# Patient Record
Sex: Male | Born: 1935 | Race: White | Hispanic: No | Marital: Married | Smoking: Former smoker
Health system: Southern US, Community
[De-identification: ages and names within clinical notes are randomized; demographics above are authoritative.]

## PROBLEM LIST (undated history)

## (undated) HISTORY — PX: BACK SURGERY: SHX140

## (undated) HISTORY — PX: OTHER SURGICAL HISTORY: SHX169

## (undated) HISTORY — PX: THYROIDECTOMY: SHX17

---

## 2010-12-08 ENCOUNTER — Other Ambulatory Visit: Payer: Self-pay | Admitting: Orthopaedic Surgery

## 2010-12-08 DIAGNOSIS — M545 Low back pain: Secondary | ICD-10-CM

## 2010-12-14 ENCOUNTER — Ambulatory Visit
Admission: RE | Admit: 2010-12-14 | Discharge: 2010-12-14 | Disposition: A | Discharge status: Home / Self Care | Payer: Medicare PPO | Source: Ambulatory Visit | Attending: Orthopaedic Surgery | Admitting: Orthopaedic Surgery

## 2010-12-14 DIAGNOSIS — M545 Low back pain: Secondary | ICD-10-CM

## 2013-12-03 ENCOUNTER — Other Ambulatory Visit: Payer: Self-pay | Admitting: Rehabilitation

## 2013-12-03 DIAGNOSIS — M549 Dorsalgia, unspecified: Secondary | ICD-10-CM

## 2013-12-04 ENCOUNTER — Ambulatory Visit
Admission: RE | Admit: 2013-12-04 | Discharge: 2013-12-04 | Disposition: A | Discharge status: Home / Self Care | Payer: Medicare PPO | Source: Ambulatory Visit | Attending: Rehabilitation | Admitting: Rehabilitation

## 2013-12-04 DIAGNOSIS — M549 Dorsalgia, unspecified: Secondary | ICD-10-CM

## 2014-07-07 ENCOUNTER — Other Ambulatory Visit: Payer: Self-pay | Admitting: Orthopedic Surgery

## 2014-07-07 DIAGNOSIS — M545 Low back pain: Secondary | ICD-10-CM

## 2014-07-12 ENCOUNTER — Ambulatory Visit
Admission: RE | Admit: 2014-07-12 | Discharge: 2014-07-12 | Disposition: A | Discharge status: Home / Self Care | Payer: Medicare PPO | Source: Ambulatory Visit | Attending: Orthopedic Surgery | Admitting: Orthopedic Surgery

## 2014-07-12 DIAGNOSIS — M545 Low back pain: Secondary | ICD-10-CM

## 2014-09-09 ENCOUNTER — Other Ambulatory Visit: Payer: Self-pay | Admitting: Orthopedic Surgery

## 2014-09-09 DIAGNOSIS — M961 Postlaminectomy syndrome, not elsewhere classified: Secondary | ICD-10-CM

## 2014-09-18 ENCOUNTER — Ambulatory Visit
Admission: RE | Admit: 2014-09-18 | Discharge: 2014-09-18 | Disposition: A | Discharge status: Home / Self Care | Payer: Medicare PPO | Source: Ambulatory Visit | Attending: Orthopedic Surgery | Admitting: Orthopedic Surgery

## 2014-09-18 DIAGNOSIS — M961 Postlaminectomy syndrome, not elsewhere classified: Secondary | ICD-10-CM

## 2015-06-30 DIAGNOSIS — G4733 Obstructive sleep apnea (adult) (pediatric): Secondary | ICD-10-CM

## 2015-06-30 DIAGNOSIS — G3184 Mild cognitive impairment, so stated: Secondary | ICD-10-CM | POA: Insufficient documentation

## 2015-06-30 HISTORY — DX: Mild cognitive impairment of uncertain or unknown etiology: G31.84

## 2015-06-30 HISTORY — DX: Obstructive sleep apnea (adult) (pediatric): G47.33

## 2015-09-28 DIAGNOSIS — G4761 Periodic limb movement disorder: Secondary | ICD-10-CM | POA: Insufficient documentation

## 2015-09-28 HISTORY — DX: Periodic limb movement disorder: G47.61

## 2016-01-13 DIAGNOSIS — I259 Chronic ischemic heart disease, unspecified: Secondary | ICD-10-CM | POA: Insufficient documentation

## 2016-01-13 DIAGNOSIS — I119 Hypertensive heart disease without heart failure: Secondary | ICD-10-CM | POA: Insufficient documentation

## 2016-01-13 DIAGNOSIS — Z79899 Other long term (current) drug therapy: Secondary | ICD-10-CM | POA: Insufficient documentation

## 2016-01-13 DIAGNOSIS — K219 Gastro-esophageal reflux disease without esophagitis: Secondary | ICD-10-CM

## 2016-01-13 DIAGNOSIS — M51379 Other intervertebral disc degeneration, lumbosacral region without mention of lumbar back pain or lower extremity pain: Secondary | ICD-10-CM | POA: Insufficient documentation

## 2016-01-13 DIAGNOSIS — M159 Polyosteoarthritis, unspecified: Secondary | ICD-10-CM

## 2016-01-13 DIAGNOSIS — M2550 Pain in unspecified joint: Secondary | ICD-10-CM | POA: Insufficient documentation

## 2016-01-13 DIAGNOSIS — I6529 Occlusion and stenosis of unspecified carotid artery: Secondary | ICD-10-CM | POA: Insufficient documentation

## 2016-01-13 DIAGNOSIS — K5730 Diverticulosis of large intestine without perforation or abscess without bleeding: Secondary | ICD-10-CM | POA: Insufficient documentation

## 2016-01-13 DIAGNOSIS — R7309 Other abnormal glucose: Secondary | ICD-10-CM

## 2016-01-13 HISTORY — DX: Chronic ischemic heart disease, unspecified: I25.9

## 2016-01-13 HISTORY — DX: Other abnormal glucose: R73.09

## 2016-01-13 HISTORY — DX: Occlusion and stenosis of unspecified carotid artery: I65.29

## 2016-01-13 HISTORY — DX: Other intervertebral disc degeneration, lumbosacral region without mention of lumbar back pain or lower extremity pain: M51.379

## 2016-01-13 HISTORY — DX: Gastro-esophageal reflux disease without esophagitis: K21.9

## 2016-01-13 HISTORY — DX: Diverticulosis of large intestine without perforation or abscess without bleeding: K57.30

## 2016-01-13 HISTORY — DX: Polyosteoarthritis, unspecified: M15.9

## 2016-01-13 HISTORY — DX: Other long term (current) drug therapy: Z79.899

## 2016-01-13 HISTORY — DX: Hypertensive heart disease without heart failure: I11.9

## 2016-01-13 HISTORY — DX: Pain in unspecified joint: M25.50

## 2016-01-14 DIAGNOSIS — G3184 Mild cognitive impairment, so stated: Secondary | ICD-10-CM

## 2016-01-14 HISTORY — DX: Mild cognitive impairment of uncertain or unknown etiology: G31.84

## 2016-07-20 DIAGNOSIS — J301 Allergic rhinitis due to pollen: Secondary | ICD-10-CM | POA: Insufficient documentation

## 2016-07-20 DIAGNOSIS — N400 Enlarged prostate without lower urinary tract symptoms: Secondary | ICD-10-CM | POA: Insufficient documentation

## 2016-07-20 HISTORY — DX: Benign prostatic hyperplasia without lower urinary tract symptoms: N40.0

## 2016-07-20 HISTORY — DX: Allergic rhinitis due to pollen: J30.1

## 2017-03-15 IMAGING — MR MR LUMBAR SPINE W/O CM
5 series · 32 of 48 positions shown · non-contrast
Comparison: MRI lumbar spine 07/12/2014 and 12/04/2013.

CLINICAL DATA: Progressive low back pain extending into the right
hip and lower extremity. Lumbar spine surgery 8 months ago.
Post-laminectomy syndrome.

EXAM:
MRI LUMBAR SPINE WITHOUT CONTRAST
TECHNIQUE: Multiplanar, multisequence MR imaging of the lumbar spine was
performed. No intravenous contrast was administered.

[Series 2: T2 post-contrast · sagittal · 4.0mm · 0.88mm/px · 7 of 14 slices shown]
[im 1/14]
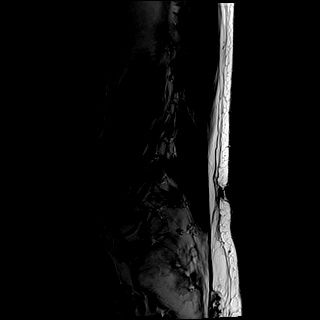
[im 3/14]
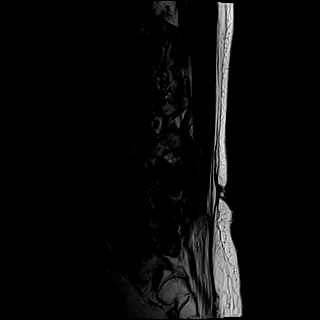
[im 5/14]
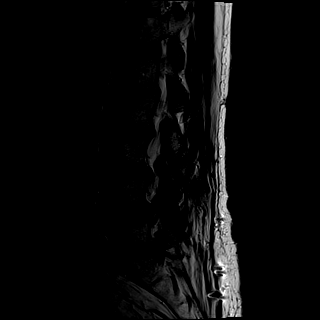
[im 7/14]
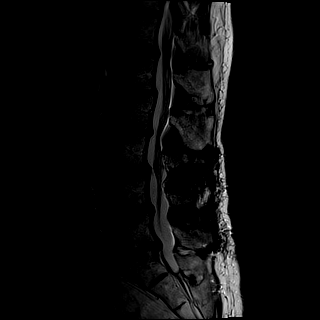
[im 9/14]
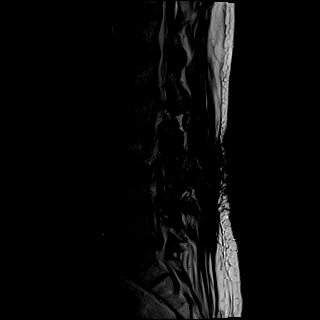
[im 11/14]
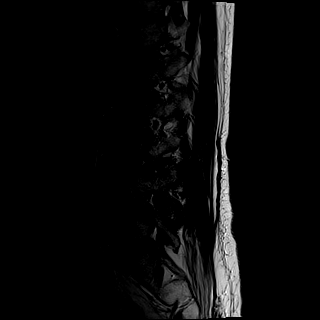
[im 14/14]
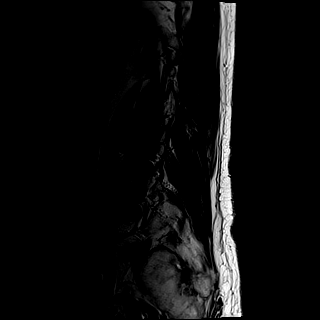

[Series 3: T1 · sagittal · 4.0mm · 0.88mm/px · 7 of 14 slices shown (1 of 2)]
[im 1/14]
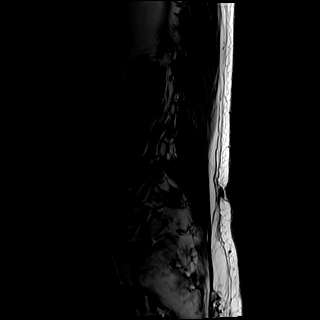
[im 3/14]
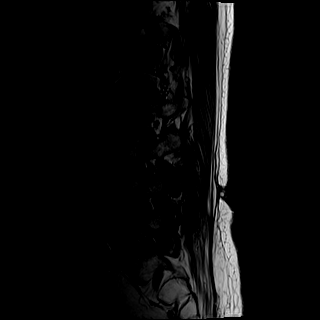
[im 5/14]
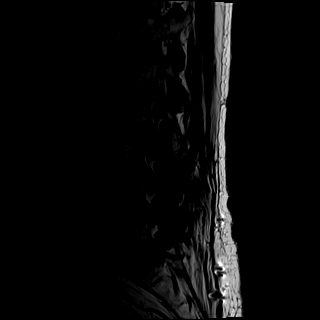
[im 7/14]
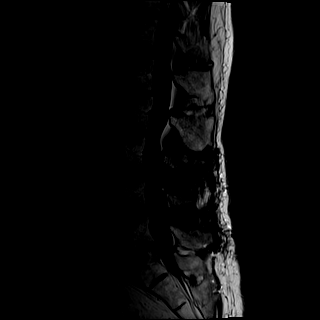
[im 9/14]
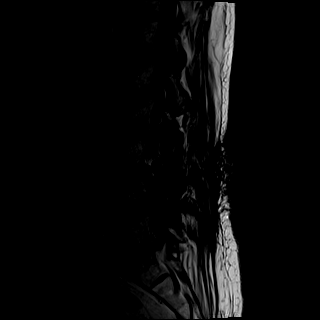
[im 11/14]
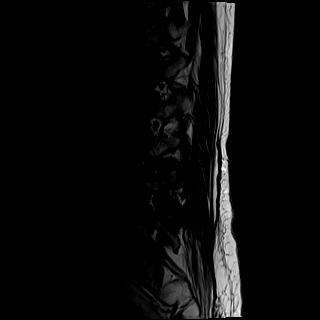
[im 14/14]
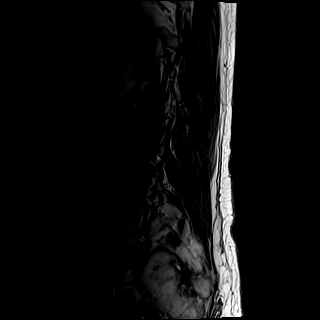

[Series 4: STIR · sagittal · 4.0mm · 0.55mm/px · 2 of 14 slices shown]
[im 1/14]
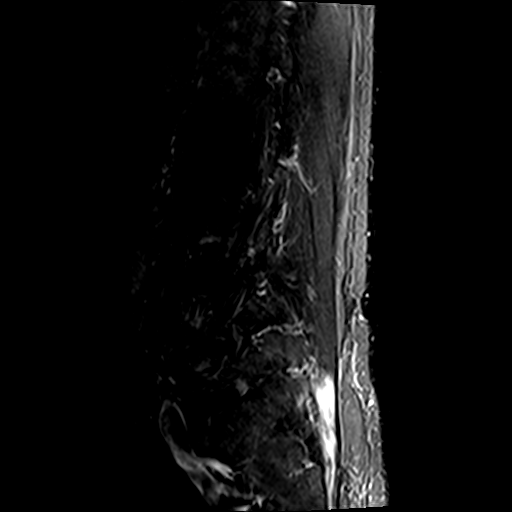
[im 3/14]
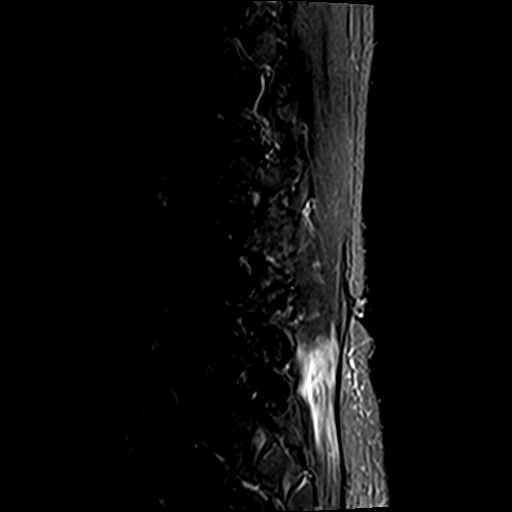

[Series 5: T1 · axial · 4.0mm · 0.37mm/px · z∈[-106,+119]mm · 8 of 32 slices shown (2 of 2)]
[im 1/32]
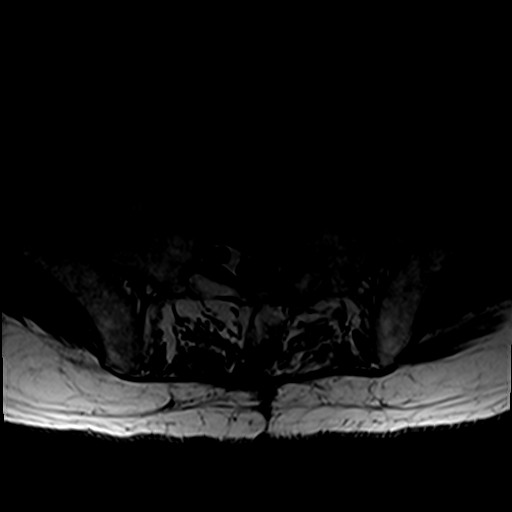
[im 5/32]
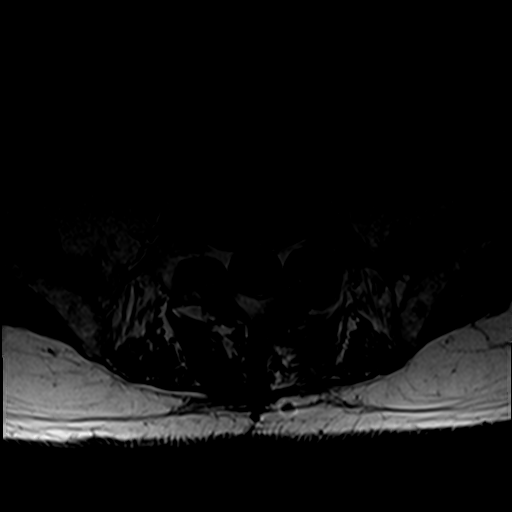
[im 10/32]
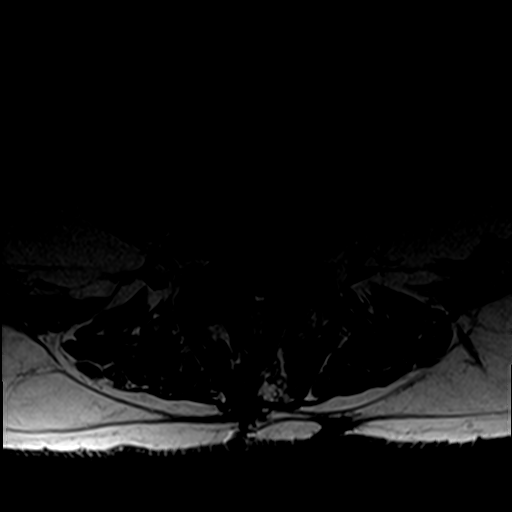
[im 15/32]
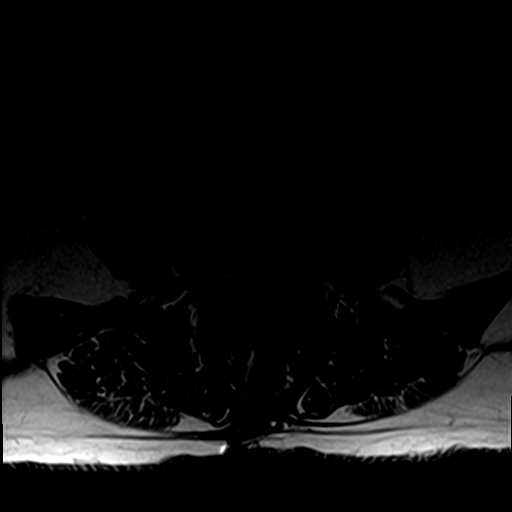
[im 17/32]
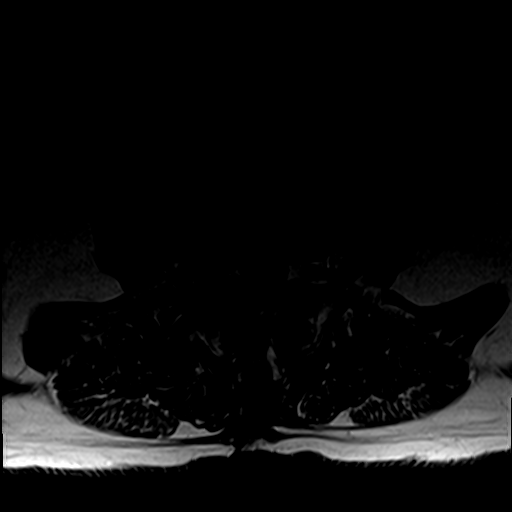
[im 22/32]
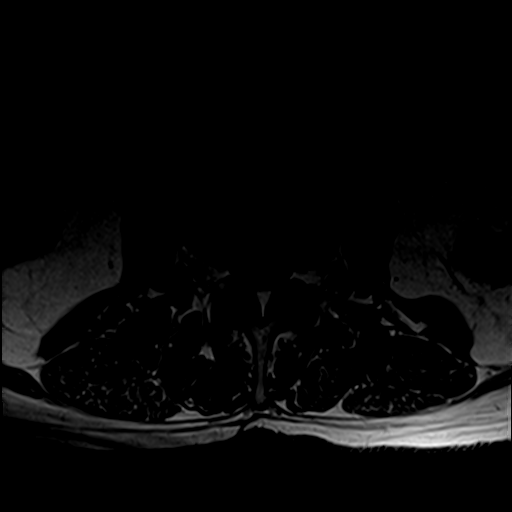
[im 27/32]
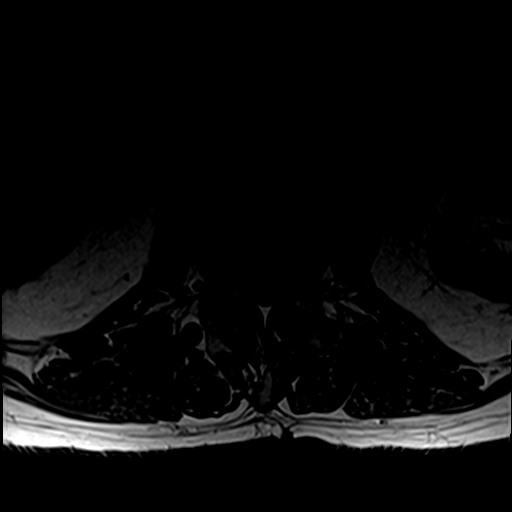
[im 32/32]
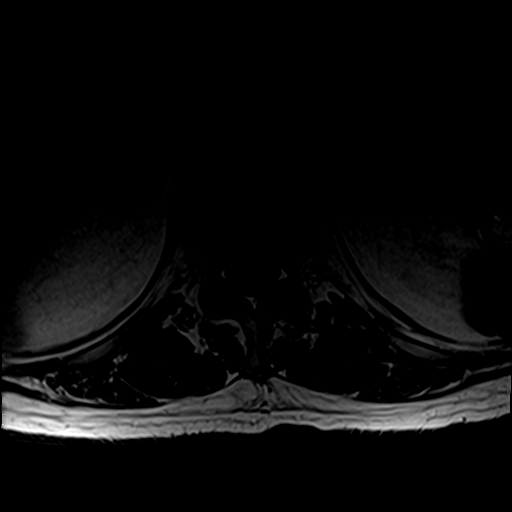

[Series 6: T2 · axial · 4.0mm · 0.74mm/px · z∈[-106,+119]mm · 8 of 32 slices shown]
[im 1/32]
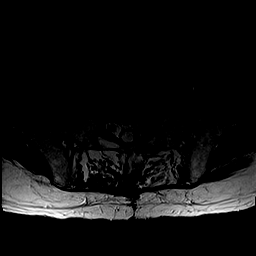
[im 5/32]
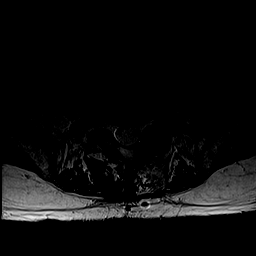
[im 10/32]
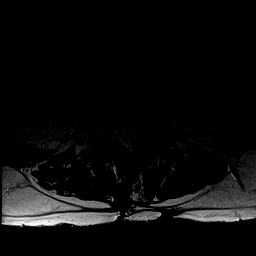
[im 15/32]
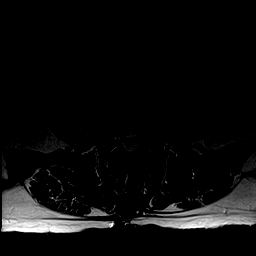
[im 17/32]
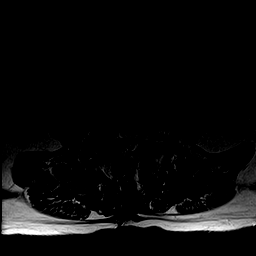
[im 22/32]
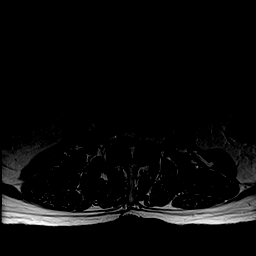
[im 27/32]
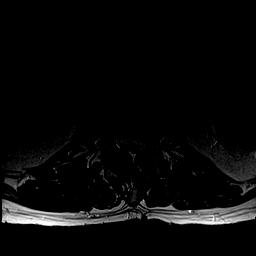
[im 32/32]
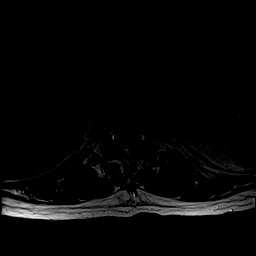

[32 of 48 positions shown; findings below may reference images not displayed]

FINDINGS: Normal signal is present in the conus medullaris which terminates at
T12-L1. A remote superior endplate fracture of L2 is stable. Marrow
signal and vertebral body heights are otherwise normal. Grade 1
anterolisthesis at L3-4 is similar to the prior study. Alignment is
otherwise anatomic. Loss of disc height and endplate changes at
L5-S1 is similar to the prior exam. Mild leftward curvature of the
lumbar spine is centered at L3.

Limited imaging of the abdomen is unremarkable. There is no
significant adenopathy.

T12-L1: Mild facet hypertrophy and disc bulging is similar to the
prior study. There is no significant focal stenosis.

L1-2: Mild facet hypertrophy is stable. There is no significant
stenosis.

L2-3: A broad-based disc protrusion is present. Moderate facet
hypertrophy is noted. Mild subarticular narrowing is stable, worse
on the left.

L3-4: A right laminotomy is noted. A residual rightward disc
protrusion is present. Moderate right subarticular and foraminal
stenosis persists. Mild left foraminal narrowing is noted.

L4-5: A broad-based disc protrusion is present. There is clumping of
the nerve roots as previously seen. Laminectomy is noted. Mild to
moderate foraminal narrowing is stable, worse on the left.

L5-S1: There is chronic loss of disc height. A left laminotomy is
noted. A broad-based disc protrusion is present. Asymmetric
left-sided facet hypertrophy is present. This results in moderate
left foraminal stenosis, unchanged.
IMPRESSION: 1. Stable postoperative changes at L3-4, L4-5, and L5-S1.
2. Moderate right subarticular and foraminal stenosis at L3-4 is the
most significant right-sided disease.
3. Mild to moderate foraminal stenosis bilaterally at L4-5 is worse
on the left, stable.
4. Moderate left foraminal stenosis at L5-S1 is stable.
5. Multilevel spondylosis is otherwise stable.
6. Remote superior endplate fracture at L2.

## 2017-04-18 DIAGNOSIS — D509 Iron deficiency anemia, unspecified: Secondary | ICD-10-CM

## 2017-04-18 HISTORY — DX: Iron deficiency anemia, unspecified: D50.9

## 2017-05-15 ENCOUNTER — Other Ambulatory Visit: Payer: Self-pay | Admitting: Orthopedic Surgery

## 2017-05-15 DIAGNOSIS — M899 Disorder of bone, unspecified: Secondary | ICD-10-CM

## 2017-06-08 ENCOUNTER — Ambulatory Visit
Admission: RE | Admit: 2017-06-08 | Discharge: 2017-06-08 | Disposition: A | Discharge status: Home / Self Care | Payer: Medicare PPO | Source: Ambulatory Visit | Attending: Orthopedic Surgery | Admitting: Orthopedic Surgery

## 2017-06-08 DIAGNOSIS — M899 Disorder of bone, unspecified: Secondary | ICD-10-CM

## 2017-06-30 ENCOUNTER — Other Ambulatory Visit: Payer: Self-pay | Admitting: Orthopedic Surgery

## 2017-07-20 DIAGNOSIS — M810 Age-related osteoporosis without current pathological fracture: Secondary | ICD-10-CM | POA: Insufficient documentation

## 2017-07-20 HISTORY — DX: Age-related osteoporosis without current pathological fracture: M81.0

## 2017-10-12 DIAGNOSIS — D513 Other dietary vitamin B12 deficiency anemia: Secondary | ICD-10-CM

## 2017-10-12 HISTORY — DX: Other dietary vitamin B12 deficiency anemia: D51.3

## 2017-12-15 DIAGNOSIS — E039 Hypothyroidism, unspecified: Secondary | ICD-10-CM | POA: Insufficient documentation

## 2017-12-15 HISTORY — DX: Hypothyroidism, unspecified: E03.9

## 2018-05-22 ENCOUNTER — Telehealth: Payer: Self-pay | Admitting: Gastroenterology

## 2018-05-22 NOTE — Telephone Encounter (Signed)
Dr.Thomas Martha ClanWhyte patient's PCP sent a referral for Dr.Gupta to see patient for follow up colonoscopy and to discuss and EGD. Spoke with patient states he will have his wife call back and schedule an appointment. States he needs an afternoon appointment.

## 2018-06-14 DIAGNOSIS — H6991 Unspecified Eustachian tube disorder, right ear: Secondary | ICD-10-CM

## 2018-06-14 DIAGNOSIS — H7403 Tympanosclerosis, bilateral: Secondary | ICD-10-CM | POA: Insufficient documentation

## 2018-06-14 HISTORY — DX: Unspecified eustachian tube disorder, right ear: H69.91

## 2018-06-14 HISTORY — DX: Tympanosclerosis, bilateral: H74.03

## 2018-07-09 DIAGNOSIS — Z8781 Personal history of (healed) traumatic fracture: Secondary | ICD-10-CM | POA: Insufficient documentation

## 2018-07-09 HISTORY — DX: Personal history of (healed) traumatic fracture: Z87.81

## 2018-07-12 ENCOUNTER — Other Ambulatory Visit: Payer: Self-pay | Admitting: Physician Assistant

## 2018-07-12 DIAGNOSIS — M810 Age-related osteoporosis without current pathological fracture: Secondary | ICD-10-CM

## 2018-07-12 DIAGNOSIS — Z8781 Personal history of (healed) traumatic fracture: Secondary | ICD-10-CM

## 2018-07-24 DIAGNOSIS — R269 Unspecified abnormalities of gait and mobility: Secondary | ICD-10-CM

## 2018-07-24 HISTORY — DX: Unspecified abnormalities of gait and mobility: R26.9

## 2018-08-14 DIAGNOSIS — R195 Other fecal abnormalities: Secondary | ICD-10-CM | POA: Insufficient documentation

## 2018-08-14 HISTORY — DX: Other fecal abnormalities: R19.5

## 2018-09-07 ENCOUNTER — Encounter: Payer: Self-pay | Admitting: Family Medicine

## 2018-10-10 ENCOUNTER — Other Ambulatory Visit: Payer: Medicare PPO

## 2018-12-12 ENCOUNTER — Other Ambulatory Visit: Payer: Self-pay

## 2018-12-12 ENCOUNTER — Ambulatory Visit
Admission: RE | Admit: 2018-12-12 | Discharge: 2018-12-12 | Disposition: A | Discharge status: Home / Self Care | Payer: Medicare PPO | Source: Ambulatory Visit | Attending: Physician Assistant | Admitting: Physician Assistant

## 2018-12-12 DIAGNOSIS — Z8781 Personal history of (healed) traumatic fracture: Secondary | ICD-10-CM

## 2018-12-12 DIAGNOSIS — M810 Age-related osteoporosis without current pathological fracture: Secondary | ICD-10-CM

## 2018-12-14 ENCOUNTER — Other Ambulatory Visit: Payer: Medicare PPO

## 2019-07-30 ENCOUNTER — Other Ambulatory Visit: Payer: Self-pay | Admitting: Rehabilitation

## 2019-08-05 DIAGNOSIS — Z789 Other specified health status: Secondary | ICD-10-CM

## 2019-08-05 HISTORY — DX: Other specified health status: Z78.9

## 2019-08-13 DIAGNOSIS — M12812 Other specific arthropathies, not elsewhere classified, left shoulder: Secondary | ICD-10-CM | POA: Insufficient documentation

## 2019-08-13 DIAGNOSIS — G8929 Other chronic pain: Secondary | ICD-10-CM | POA: Insufficient documentation

## 2019-08-13 HISTORY — DX: Other specific arthropathies, not elsewhere classified, left shoulder: M12.812

## 2019-08-13 HISTORY — DX: Other chronic pain: G89.29

## 2019-08-15 ENCOUNTER — Telehealth: Payer: Self-pay

## 2019-08-15 NOTE — Telephone Encounter (Signed)
Made in error

## 2019-08-16 DIAGNOSIS — R001 Bradycardia, unspecified: Secondary | ICD-10-CM | POA: Insufficient documentation

## 2019-08-16 DIAGNOSIS — I480 Paroxysmal atrial fibrillation: Secondary | ICD-10-CM

## 2019-08-16 HISTORY — DX: Paroxysmal atrial fibrillation: I48.0

## 2019-08-16 HISTORY — DX: Bradycardia, unspecified: R00.1

## 2019-08-17 ENCOUNTER — Other Ambulatory Visit: Payer: Self-pay | Admitting: Rehabilitation

## 2019-08-22 DIAGNOSIS — R7303 Prediabetes: Secondary | ICD-10-CM

## 2019-08-22 DIAGNOSIS — Z87898 Personal history of other specified conditions: Secondary | ICD-10-CM | POA: Insufficient documentation

## 2019-08-22 DIAGNOSIS — Z9189 Other specified personal risk factors, not elsewhere classified: Secondary | ICD-10-CM | POA: Insufficient documentation

## 2019-08-22 DIAGNOSIS — I10 Essential (primary) hypertension: Secondary | ICD-10-CM

## 2019-08-22 HISTORY — DX: Essential (primary) hypertension: I10

## 2019-08-22 HISTORY — DX: Other specified personal risk factors, not elsewhere classified: Z91.89

## 2019-08-22 HISTORY — DX: Personal history of other specified conditions: Z87.898

## 2019-08-22 HISTORY — DX: Prediabetes: R73.03

## 2019-08-28 DIAGNOSIS — Z7901 Long term (current) use of anticoagulants: Secondary | ICD-10-CM

## 2019-08-28 DIAGNOSIS — E782 Mixed hyperlipidemia: Secondary | ICD-10-CM | POA: Insufficient documentation

## 2019-08-28 HISTORY — DX: Long term (current) use of anticoagulants: Z79.01

## 2019-08-28 HISTORY — DX: Mixed hyperlipidemia: E78.2

## 2019-09-03 DIAGNOSIS — Z96612 Presence of left artificial shoulder joint: Secondary | ICD-10-CM | POA: Insufficient documentation

## 2019-09-03 HISTORY — DX: Presence of left artificial shoulder joint: Z96.612

## 2020-10-02 DIAGNOSIS — F0390 Unspecified dementia without behavioral disturbance: Secondary | ICD-10-CM | POA: Insufficient documentation

## 2020-10-02 HISTORY — DX: Unspecified dementia, unspecified severity, without behavioral disturbance, psychotic disturbance, mood disturbance, and anxiety: F03.90

## 2021-10-14 DIAGNOSIS — I2510 Atherosclerotic heart disease of native coronary artery without angina pectoris: Secondary | ICD-10-CM

## 2021-10-14 HISTORY — DX: Atherosclerotic heart disease of native coronary artery without angina pectoris: I25.10

## 2022-04-21 DIAGNOSIS — I480 Paroxysmal atrial fibrillation: Secondary | ICD-10-CM

## 2022-04-21 DIAGNOSIS — D6869 Other thrombophilia: Secondary | ICD-10-CM

## 2022-04-21 DIAGNOSIS — N3946 Mixed incontinence: Secondary | ICD-10-CM

## 2022-04-21 HISTORY — DX: Mixed incontinence: N39.46

## 2022-04-21 HISTORY — DX: Paroxysmal atrial fibrillation: I48.0

## 2022-04-21 HISTORY — DX: Other thrombophilia: D68.69

## 2022-08-30 DIAGNOSIS — U071 COVID-19: Secondary | ICD-10-CM | POA: Insufficient documentation

## 2022-08-30 HISTORY — DX: COVID-19: U07.1

## 2022-09-13 DIAGNOSIS — F05 Delirium due to known physiological condition: Secondary | ICD-10-CM | POA: Insufficient documentation

## 2022-09-13 DIAGNOSIS — E871 Hypo-osmolality and hyponatremia: Secondary | ICD-10-CM

## 2022-09-13 HISTORY — DX: Delirium due to known physiological condition: F05

## 2022-09-13 HISTORY — DX: Hypo-osmolality and hyponatremia: E87.1

## 2022-09-13 HISTORY — DX: Hypocalcemia: E83.51

## 2022-10-17 DIAGNOSIS — G20C Parkinsonism, unspecified: Secondary | ICD-10-CM | POA: Insufficient documentation

## 2022-10-17 HISTORY — DX: Parkinsonism, unspecified: G20.C

## 2023-04-18 ENCOUNTER — Ambulatory Visit: Payer: Medicare PPO | Attending: Cardiology

## 2023-04-18 ENCOUNTER — Ambulatory Visit: Payer: Medicare PPO | Attending: Cardiology | Admitting: Cardiology

## 2023-04-18 ENCOUNTER — Encounter: Payer: Self-pay | Admitting: Cardiology

## 2023-04-18 VITALS — BP 110/42 | HR 72 | Ht 72.0 in | Wt 184.0 lb

## 2023-04-18 DIAGNOSIS — R0609 Other forms of dyspnea: Secondary | ICD-10-CM

## 2023-04-18 DIAGNOSIS — I2510 Atherosclerotic heart disease of native coronary artery without angina pectoris: Secondary | ICD-10-CM

## 2023-04-18 DIAGNOSIS — G20A1 Parkinson's disease without dyskinesia, without mention of fluctuations: Secondary | ICD-10-CM

## 2023-04-18 DIAGNOSIS — I10 Essential (primary) hypertension: Secondary | ICD-10-CM | POA: Diagnosis not present

## 2023-04-18 DIAGNOSIS — I4821 Permanent atrial fibrillation: Secondary | ICD-10-CM | POA: Diagnosis not present

## 2023-04-18 DIAGNOSIS — G4733 Obstructive sleep apnea (adult) (pediatric): Secondary | ICD-10-CM

## 2023-04-18 HISTORY — DX: Permanent atrial fibrillation: I48.21

## 2023-04-18 NOTE — Progress Notes (Unsigned)
Cardiology Consultation:    Date:  04/18/2023   ID:  Kenneth Glen Sr., DOB January 23, 1931, MRN [id number]  PCP:  Everlean Cherry, MD  Cardiologist:  Gypsy Balsam, MD   Referring MD: Everlean Cherry, MD   Chief Complaint  Patient presents with   Chest Pain        Headache   Atrial Fibrillation         Dizziness   Hypertension    History of Present Illness:    Kenneth Banter Sr. is a 89 y.o. male who is being seen today for the evaluation of atrial fibrillation, chest pain at the request of Everlean Cherry, MD. past medical history significant for essential hypertension, dementia, Parkinson, atrial fibrillation which appears to be permanent.  He was referred to be established as a patient with his cardiac problem.  He comes today to my office with his wife.  Both of them telling me that he never had any heart trouble.  There is no myocardial infarction there is no congestive heart failure to first issue with his heart was incidentally discovered atrial fibrillation.  He is completely asymptomatic with that he denies have any chest pain tightness squeezing pressure burning chest.  No palpitations dizziness swelling of lower extremities.  Additionally he had 1 episode of chest pain that happened about 2 weeks ago lasting for few minutes it happened at rest.  Since that time no more episodes.  He does apparently his son he lives a fairly sedentary lifestyle.  He moves around with a walker.  He does have some memory issue he for example does not remember where he works in his professional life.  But still seems to be managing activities of daily living quite reasonably.  No swelling of lower extremities no passing out no dizziness  History reviewed. No pertinent past medical history.  Past Surgical History:  Procedure Laterality Date   BACK SURGERY     x2   hernia repaired     Shoulder revision replacement     THYROIDECTOMY      Current Medications: Current  Meds  Medication Sig   acetaminophen (TYLENOL) 500 MG tablet Take 500 mg by mouth every 6 (six) hours as needed for mild pain (pain score 1-3) or moderate pain (pain score 4-6).   alendronate (FOSAMAX) 70 MG tablet Take 70 mg by mouth once a week.   Ascorbic Acid (VITAMIN C PO) Take 1 tablet by mouth daily.   bethanechol (URECHOLINE) 25 MG tablet Take 25 mg by mouth 3 (three) times daily.   CALCIUM CITRATE PO Take 1,200 mg by mouth daily.   carbidopa-levodopa (SINEMET IR) 25-100 MG tablet Take 1 tablet by mouth daily.   Cholecalciferol (VITAMIN D3) 50 MCG (2000 UT) capsule Take 2,000 Units by mouth 2 (two) times daily.   cyanocobalamin (VITAMIN B12) 1000 MCG tablet Take 1,000 mcg by mouth daily.   ELIQUIS 5 MG TABS tablet Take 5 mg by mouth 2 (two) times daily.   HYDROcodone-acetaminophen (NORCO) 10-325 MG tablet Take 1 tablet by mouth daily.   Iron-Vitamin C (VITRON-C PO) Take 1 tablet by mouth daily. 65mg /125mg    levothyroxine (SYNTHROID) 25 MCG tablet Take 25 mcg by mouth daily before breakfast.   losartan (COZAAR) 100 MG tablet Take 100 mg by mouth daily.   magnesium oxide (MAG-OX) 400 (240 Mg) MG tablet Take 400 mg by mouth daily.   memantine (NAMENDA) 10 MG tablet Take 10 mg by mouth 2 (two) times  daily.   Omega-3 Fatty Acids (FISH OIL) 1200 MG CAPS Take 1 capsule by mouth 2 (two) times daily.   QUERCETIN PO Take 1 tablet by mouth daily.   tamsulosin (FLOMAX) 0.4 MG CAPS capsule Take 0.4 mg by mouth in the morning and at bedtime.   zinc gluconate 50 MG tablet Take 50 mg by mouth daily.     Allergies:   Rosuvastatin, Atorvastatin, Clarithromycin, Monosodium glutamate, Other, and Oxycodone   Social History   Socioeconomic History   Marital status: Married    Spouse name: Not on file   Number of children: Not on file   Years of education: Not on file   Highest education level: Not on file  Occupational History   Not on file  Tobacco Use   Smoking status: Former    Current  packs/day: 0.00    Types: Cigarettes    Quit date: 04/18/1967    Years since quitting: 56.0   Smokeless tobacco: Never  Substance and Sexual Activity   Alcohol use: Never   Drug use: Never   Sexual activity: Not Currently  Other Topics Concern   Not on file  Social History Narrative   Not on file   Social Determinants of Health   Financial Resource Strain: Low Risk  (11/13/2020)   Received from [facility]   Overall Financial Resource Strain (CARDIA)    Difficulty of Paying Living Expenses: Not hard at all  Food Insecurity: Low Risk  (10/16/2022)   Received from [facility]   Hunger Vital Sign    Worried About Running Out of Food in the Last Year: Never true    Ran Out of Food in the Last Year: Never true  Transportation Needs: Not on file (10/16/2022)  Physical Activity: Inactive (11/13/2020)   Received from [facility]   Exercise Vital Sign    Days of Exercise per Week: 0 days    Minutes of Exercise per Session: 0 min  Stress: No Stress Concern Present (11/13/2020)   Received from [facility] - Occupational Stress Questionnaire    Feeling of Stress : Not at all  Social Connections: Unknown (11/07/2021)   Received from [facility]   Social Network    Social Network: Not on file     Family History: The patient's family history includes Stroke in his father and mother. ROS:   Please see the history of present illness.    All 14 point review of systems negative except as described per history of present illness.  EKGs/Labs/Other Studies Reviewed:    The following studies were reviewed today:   EKG:   Atrial fibrillation with controlled ventricular rate 72 bpm, low voltage EKG, cannot rule out anterior wall MI    Recent Labs: No results found for requested labs within last 365 days.  Recent Lipid Panel No results found for: "CHOL", "TRIG", "HDL", "CHOLHDL", "VLDL",  "LDLCALC", "LDLDIRECT"  Physical Exam:    VS:  BP (!) 110/42 (BP Location: Left Arm, Patient Position: Sitting)   Pulse 72   Ht 6' (1.829 m)   Wt 184 lb (83.5 kg)   SpO2 93%   BMI 24.95 kg/m     Wt Readings from Last 3 Encounters:  04/18/23 184 lb (83.5 kg)     GEN:  Well nourished, well developed in no acute distress HEENT: Normal NECK: No JVD; No carotid bruits LYMPHATICS: No lymphadenopathy CARDIAC: RRR, no murmurs, no  rubs, no gallops RESPIRATORY:  Clear to auscultation without rales, wheezing or rhonchi  ABDOMEN: Soft, non-tender, non-distended MUSCULOSKELETAL:  No edema; No deformity  SKIN: Warm and dry NEUROLOGIC:  Alert and oriented x 3 PSYCHIATRIC:  Normal affect   ASSESSMENT:    1. Hypertension, essential   2. Dyspnea on exertion   3. Permanent atrial fibrillation (HCC)   4. ASCVD (arteriosclerotic cardiovascular disease)   5. Obstructive sleep apnea (adult) (pediatric)   6. Parkinson's disease, unspecified whether dyskinesia present, unspecified whether manifestations fluctuate (HCC)    PLAN:    In order of problems listed above:  Atrial fibrillation with controlled ventricular.-Concerned about potentially having bradycardia.  Previously he was on beta-blocker that being the withdrawn secondary to bradycardia.  I will ask him to wear Zio patch for at least 1 week to see what the heart rate variability is and to make sure he does not have significant symptomatic bradycardia.  Part of her evaluation also included echocardiogram when we have a chance to look at his left ventricle ejection fraction as well as but atrial size and valvular functions.  He is anticoagulated Eliquis dose is 5 mg twice daily which is appropriate to his age, weight, kidney function.  Will continue his of some issue with epistaxis however it was quite easily controlled. Chest pain just 1 episode of atypical.  I will task him to let me know if he develop more episodes.  Then we will do  ischemia workup until then I will suggest simply continue conservative approach. Hypertension: Blood pressure seems to well-controlled we will continue present management. Dyslipidemia: And see last cholesterol done in 2021 at [facility] with LDL 88 and total cholesterol 160.  In the future we may consider repeating the test.   Medication Adjustments/Labs and Tests Ordered: Current medicines are reviewed at length with the patient today.  Concerns regarding medicines are outlined above.  Orders Placed This Encounter  Procedures   LONG TERM MONITOR (3-14 DAYS)   EKG 12-Lead   ECHOCARDIOGRAM COMPLETE   No orders of the defined types were placed in this encounter.   Signed, Georgeanna Lea, MD, [facility]. 04/18/2023 11:58 AM    [facility]

## 2023-04-18 NOTE — Patient Instructions (Signed)
Medication Instructions:  Your physician recommends that you continue on your current medications as directed. Please refer to the Current Medication list given to you today.  *If you need a refill on your cardiac medications before your next appointment, please call your pharmacy*   Lab Work: None Ordered If you have labs (blood work) drawn today and your tests are completely normal, you will receive your results only by: [place] (if you have MyChart) OR A paper copy in the mail If you have any lab test that is abnormal or we need to change your treatment, we will call you to review the results.   Testing/Procedures:  WHY IS MY DOCTOR PRESCRIBING ZIO? The Zio system is proven and trusted by physicians to detect and diagnose irregular heart rhythms -- and has been prescribed to hundreds of thousands of patients.  The FDA has cleared the Zio system to monitor for many different kinds of irregular heart rhythms. In a study, physicians were able to reach a diagnosis 90% of the time with the Zio system1.  You can wear the Zio monitor -- a small, discreet, comfortable patch -- during your normal day-to-day activity, including while you sleep, shower, and exercise, while it records every single heartbeat for analysis.  1Barrett, P., et al. Comparison of 24 Hour Holter Monitoring Versus 14 Day Novel Adhesive Patch Electrocardiographic Monitoring. [place], 2014.  ZIO VS. HOLTER MONITORING The Zio monitor can be comfortably worn for up to 14 days. Holter monitors can be worn for 24 to 48 hours, limiting the time to record any irregular heart rhythms you may have. Zio is able to capture data for the 51% of patients who have their first symptom-triggered arrhythmia after 48 hours.1  LIVE WITHOUT RESTRICTIONS The Zio ambulatory cardiac monitor is a small, unobtrusive, and water-resistant patch--you might even forget you're wearing it. The Zio monitor records and stores  every beat of your heart, whether you're sleeping, working out, or showering.    Your physician has requested that you have an echocardiogram. Echocardiography is a painless test that uses sound waves to create images of your heart. It provides your doctor with information about the size and shape of your heart and how well your heart's chambers and valves are working. This procedure takes approximately one hour. There are no restrictions for this procedure. Please do NOT wear cologne, perfume, aftershave, or lotions (deodorant is allowed). Please arrive 15 minutes prior to your appointment time.    Follow-Up: At [facility], you and your health needs are our priority.  As part of our continuing mission to provide you with exceptional heart care, we have created designated Provider Care Teams.  These Care Teams include your primary Cardiologist (physician) and Advanced Practice Providers (APPs -  Physician Assistants and Nurse Practitioners) who all work together to provide you with the care you need, when you need it.  We recommend signing up for the patient portal called "MyChart".  Sign up information is provided on this After Visit Summary.  MyChart is used to connect with patients for Virtual Visits (Telemedicine).  Patients are able to view lab/test results, encounter notes, upcoming appointments, etc.  Non-urgent messages can be sent to your provider as well.   To learn more about what you can do with MyChart, go to [email or web address].    Your next appointment:   2 month(s)  The format for your next appointment:   In Person  Provider:   Jenne Campus, MD  Other Instructions NA  

## 2023-05-11 ENCOUNTER — Ambulatory Visit: Payer: Medicare PPO | Attending: Cardiology

## 2023-05-11 DIAGNOSIS — R0609 Other forms of dyspnea: Secondary | ICD-10-CM

## 2023-05-11 LAB — ECHOCARDIOGRAM COMPLETE
P 1/2 time: 598 ms
S' Lateral: 2.9 cm

## 2023-06-08 ENCOUNTER — Telehealth: Payer: Self-pay

## 2023-06-08 NOTE — Telephone Encounter (Signed)
Spoke with Kenneth Pratt on DPR, notified of results

## 2023-06-08 NOTE — Telephone Encounter (Signed)
-----   Message from Gypsy Balsam sent at 05/18/2023  9:19 AM EST ----- Echocardiogram showed preserved left ventricle ejection fraction, mildly elevated pulm artery pressure, mild mitral valve regurgitation, all of this for medical therapy

## 2023-06-08 NOTE — Telephone Encounter (Signed)
LM to return my call. 

## 2023-06-08 NOTE — Telephone Encounter (Signed)
-----   Message from Gypsy Balsam sent at 05/19/2023 11:29 AM EST ----- Atrial fibrillation noted throughout recording with average rate 59 continue monitoring

## 2023-06-08 NOTE — Telephone Encounter (Signed)
-----   Message from [facility] Keyleen Cerrato P sent at 06/08/2023  4:23 PM EST -----  ----- Message ----- From: Georgeanna Lea, MD Sent: 05/19/2023  11:29 AM EST To: Neena Rhymes, RN  Atrial fibrillation noted throughout recording with average rate 59 continue monitoring

## 2023-06-30 ENCOUNTER — Encounter: Payer: Self-pay | Admitting: Cardiology

## 2023-07-03 ENCOUNTER — Encounter: Payer: Self-pay | Admitting: Cardiology

## 2023-07-03 ENCOUNTER — Ambulatory Visit: Payer: Medicare PPO | Attending: Cardiology | Admitting: Cardiology

## 2023-07-03 VITALS — BP 128/56 | HR 58 | Ht 72.0 in | Wt 186.6 lb

## 2023-07-03 DIAGNOSIS — I119 Hypertensive heart disease without heart failure: Secondary | ICD-10-CM

## 2023-07-03 DIAGNOSIS — F0390 Unspecified dementia without behavioral disturbance: Secondary | ICD-10-CM

## 2023-07-03 DIAGNOSIS — G20A1 Parkinson's disease without dyskinesia, without mention of fluctuations: Secondary | ICD-10-CM

## 2023-07-03 DIAGNOSIS — I4821 Permanent atrial fibrillation: Secondary | ICD-10-CM | POA: Diagnosis not present

## 2023-07-03 DIAGNOSIS — I10 Essential (primary) hypertension: Secondary | ICD-10-CM | POA: Diagnosis not present

## 2023-07-03 MED ORDER — NITROGLYCERIN 0.4 MG SL SUBL: 0.4000 mg | SUBLINGUAL_TABLET | SUBLINGUAL | 6 refills | Status: AC | PRN

## 2023-07-03 NOTE — Progress Notes (Signed)
 Cardiology Office Note:    Date:  07/03/2023   ID:  Kenneth Cordella Roy Sr., DOB 09/16/1930, MRN [id number]  PCP:  Magdaline Debby HERO, MD  Cardiologist:  Lamar Fitch, MD    Referring MD: Magdaline Debby HERO, MD   No chief complaint on file.   History of Present Illness:    Kenneth Osment Sr. is a 89 y.o. male medical history significant for permanent atrial fibrillation, atypical chest pain, Parkinson, dementia he was referred to us  because of multiple cardiac issues.  Concern was about his atrial fibrillation.  He is anticoagulated dose is appropriate to his age weight and kidney function which I continue.  He did her monitor monitor show persistent atrial fibrillation with total burden of 100%, rate was average 59.  He did have few pauses more than 3.3 seconds that happened always during the night.  Also few episodes of ventricular tachycardia but no sustained only few beats.  Echocardiogram has been performed which showed preserved left ventricle ejection fraction.  Since have seen him last time he describes 1 episode of chest pain that awoke him in the middle of the night.  911 was called he came in the meantime he took aspirin when EMS came EKG has been done showed no acute changes he refused to go to the hospital since he was completely asymptomatic since that time he is doing well.  Past Medical History:  Diagnosis Date   Abnormal glucose 01/13/2016   Age-related osteoporosis without current pathological fracture 07/20/2017   Amnestic MCI (mild cognitive impairment with memory loss) 01/14/2016   Anticoagulant long-term use 08/28/2019   ASCVD (arteriosclerotic cardiovascular disease) 10/14/2021   At risk for delirium 08/22/2019   Benign hypertensive heart disease 01/13/2016   Bradycardia 08/16/2019   Carotid artery occlusion without infarction 01/13/2016   Chronic left shoulder pain 08/13/2019   COVID-19 virus infection 08/30/2022   DDD (degenerative disc disease),  lumbosacral 01/13/2016   Delirium due to general medical condition 09/13/2022   Dementia without behavioral disturbance (HCC) 10/02/2020   Diverticulosis of colon 01/13/2016   Drug therapy 01/13/2016   Dysfunction of right eustachian tube 06/14/2018   Esophageal reflux 01/13/2016   Fecal occult blood test positive 08/14/2018   Gait disturbance 07/24/2018   Hay fever 07/20/2016   History of compression fracture of spine 07/09/2018   History of delirium 08/22/2019   Hypercoagulable state due to paroxysmal atrial fibrillation (HCC) 04/21/2022   Hypertension, essential 08/22/2019   Hypocalcemia 09/13/2022   Hyponatremia 09/13/2022   Hypothyroidism, adult 12/15/2017   Iron deficiency anemia 04/18/2017   Mild neurocognitive disorder 06/30/2015   Mixed hyperlipidemia 08/28/2019   Obstructive sleep apnea (adult) (pediatric) 06/30/2015   Osteoarthritis, generalized 01/13/2016   Other dietary vitamin B12 deficiency anemia 10/12/2017   PAF (paroxysmal atrial fibrillation) (HCC) 08/16/2019   Pain in joints 01/13/2016   Parkinsonism (HCC) 10/17/2022   Permanent atrial fibrillation (HCC) 04/18/2023   PLMD (periodic limb movement disorder) 09/28/2015   Prediabetes 08/22/2019   Prostatism 07/20/2016   Resting ischemia due to ischemic heart disease 01/13/2016   Rotator cuff arthropathy, left 08/13/2019   S/P reverse total shoulder arthroplasty, left 09/03/2019   Statin intolerance 08/05/2019   Tympanosclerosis involving tympanic membrane only, bilateral 06/14/2018   With right monomeric membrane     Urinary incontinence, mixed 04/21/2022    Past Surgical History:  Procedure Laterality Date   BACK SURGERY     x2   hernia repaired     Shoulder revision replacement  THYROIDECTOMY      Current Medications: Current Meds  Medication Sig   acetaminophen (TYLENOL) 500 MG tablet Take 500 mg by mouth every 6 (six) hours as needed for mild pain (pain score 1-3) or moderate pain (pain score  4-6).   alendronate (FOSAMAX) 70 MG tablet Take 70 mg by mouth once a week.   Ascorbic Acid (VITAMIN C PO) Take 1 tablet by mouth daily.   bethanechol (URECHOLINE) 25 MG tablet Take 25 mg by mouth 3 (three) times daily.   CALCIUM CITRATE PO Take 1,200 mg by mouth daily.   carbidopa-levodopa (SINEMET IR) 25-100 MG tablet Take 1 tablet by mouth daily.   Cholecalciferol (VITAMIN D3) 50 MCG (2000 UT) capsule Take 2,000 Units by mouth 2 (two) times daily.   cyanocobalamin (VITAMIN B12) 1000 MCG tablet Take 1,000 mcg by mouth daily.   ELIQUIS 5 MG TABS tablet Take 5 mg by mouth 2 (two) times daily.   fluticasone (FLONASE) 50 MCG/ACT nasal spray Place 1 spray into both nostrils daily.   HYDROcodone-acetaminophen (NORCO) 10-325 MG tablet Take 1 tablet by mouth daily.   Iron-Vitamin C (VITRON-C PO) Take 1 tablet by mouth daily. 65mg /125mg    levothyroxine (SYNTHROID) 25 MCG tablet Take 25 mcg by mouth daily before breakfast.   losartan (COZAAR) 100 MG tablet Take 100 mg by mouth daily.   magnesium oxide (MAG-OX) 400 (240 Mg) MG tablet Take 400 mg by mouth daily.   memantine (NAMENDA) 10 MG tablet Take 10 mg by mouth 2 (two) times daily.   nitroGLYCERIN  (NITROSTAT ) 0.4 MG SL tablet Place 1 tablet (0.4 mg total) under the tongue every 5 (five) minutes as needed for chest pain.   Omega-3 Fatty Acids (FISH OIL) 1200 MG CAPS Take 1 capsule by mouth 2 (two) times daily.   QUERCETIN PO Take 1 tablet by mouth daily.   tamsulosin (FLOMAX) 0.4 MG CAPS capsule Take 0.4 mg by mouth in the morning and at bedtime.   zinc gluconate 50 MG tablet Take 50 mg by mouth daily.     Allergies:   Rosuvastatin, Atorvastatin, Clarithromycin, Monosodium glutamate, Other, and Oxycodone   Social History   Socioeconomic History   Marital status: Married    Spouse name: Not on file   Number of children: Not on file   Years of education: Not on file   Highest education level: Not on file  Occupational History   Not on file   Tobacco Use   Smoking status: Former    Current packs/day: 0.00    Types: Cigarettes    Quit date: 04/18/1967    Years since quitting: 56.2   Smokeless tobacco: Never  Substance and Sexual Activity   Alcohol use: Never   Drug use: Never   Sexual activity: Not Currently  Other Topics Concern   Not on file  Social History Narrative   Not on file   Social Drivers of Health   Financial Resource Strain: Low Risk  (11/13/2020)   Received [facility]   Overall Financial Resource Strain (CARDIA)    Difficulty of Paying Living Expenses: Not hard at all  Food Insecurity: Low Risk  (10/16/2022)   Received from [facility]   Hunger Vital Sign    Worried About Running Out of Food in the Last Year: Never true    Ran Out of Food in the Last Year: Never true  Transportation Needs: Not on file (10/16/2022)  Physical Activity: Inactive (11/13/2020)   Received from [facility]   Exercise Vital Sign    Days of Exercise per Week: 0 days    Minutes of Exercise per Session: 0 min  Stress: No Stress Concern Present (11/13/2020)   Received from [facility] - Occupational Stress Questionnaire    Feeling of Stress : Not at all  Social Connections: Unknown (11/07/2021)   Received from [facility]   Social Network    Social Network: Not on file     Family History: The patient's family history includes Stroke in his father and mother. ROS:   Please see the history of present illness.    All 14 point review of systems negative except as described per history of present illness  EKGs/Labs/Other Studies Reviewed:         Recent Labs: No results found for requested labs within last 365 days.  Recent Lipid Panel No results found for: CHOL, TRIG, HDL, CHOLHDL, VLDL, LDLCALC, LDLDIRECT  Physical Exam:    VS:  BP (!) 128/56   Pulse (!) 58   Ht 6' (1.829 m)   Wt 186 lb 9.6 oz  (84.6 kg)   SpO2 96%   BMI 25.31 kg/m     Wt Readings from Last 3 Encounters:  07/03/23 186 lb 9.6 oz (84.6 kg)  04/18/23 184 lb (83.5 kg)     GEN:  Well nourished, well developed in no acute distress HEENT: Normal NECK: No JVD; No carotid bruits LYMPHATICS: No lymphadenopathy CARDIAC: Irregularly irregular, no murmurs, no rubs, no gallops RESPIRATORY:  Clear to auscultation without rales, wheezing or rhonchi  ABDOMEN: Soft, non-tender, non-distended MUSCULOSKELETAL:  No edema; No deformity  SKIN: Warm and dry LOWER EXTREMITIES: no swelling NEUROLOGIC:  Alert and oriented x 3 PSYCHIATRIC:  Normal affect   ASSESSMENT:    1. Permanent atrial fibrillation (HCC)   2. Hypertension, essential   3. Benign hypertensive heart disease without congestive heart failure   4. Parkinson's disease, unspecified whether dyskinesia present, unspecified whether manifestations fluctuate (HCC)   5. Dementia without behavioral disturbance (HCC)    PLAN:    In order of problems listed above:  Atrial fibrillation permanent anticoagulated continue present management. Bradycardia but not critical happening only during the night asymptomatic I warned him about signs and symptoms of significant bradycardia ask him to let me know if he develop this. Chest pain with some characteristic concerning.  However it happens only during the night.  I will give prescription for nitroglycerin  I see him back in about 3 months to see if it still happening if there is we may consider doing ischemia workup or maybe just simply put him on permanent antianginal therapy. Parkinson disease with dementia.  Stable he is very pleasant   Medication Adjustments/Labs and Tests Ordered: Current medicines are reviewed at length with the patient today.  Concerns regarding medicines are outlined above.  No orders of the defined types were placed in this encounter.  Medication changes:  Meds ordered this encounter  Medications    nitroGLYCERIN  (NITROSTAT ) 0.4 MG SL tablet    Sig: Place 1 tablet (0.4 mg total) under the tongue every 5 (five) minutes as needed for chest pain.    Dispense:  25 tablet    Refill:  6    Signed, Lamar DOROTHA Fitch, MD, [facility] 07/03/2023 4:36 PM    [facility]

## 2023-07-03 NOTE — Patient Instructions (Addendum)
 Medication Instructions:  Nitroglycerin  Use nitroglycerin  1 tablet placed under the tongue at the first sign of chest pain or an angina attack. 1 tablet may be used every 5 minutes as needed, for up to 15 minutes. Do not take more than 3 tablets in 15 minutes. If pain persist call 911 or go to the nearest ED.     Lab Work: None Ordered If you have labs (blood work) drawn today and your tests are completely normal, you will receive your results only by: MyChart Message (if you have MyChart) OR A paper copy in the mail If you have any lab test that is abnormal or we need to change your treatment, we will call you to review the results.   Testing/Procedures: None Ordered   Follow-Up: At [facility], you and your health needs are our priority.  As part of our continuing mission to provide you with exceptional heart care, we have created designated Provider Care Teams.  These Care Teams include your primary Cardiologist (physician) and Advanced Practice Providers (APPs -  Physician Assistants and Nurse Practitioners) who all work together to provide you with the care you need, when you need it.  We recommend signing up for the patient portal called MyChart.  Sign up information is provided on this After Visit Summary.  MyChart is used to connect with patients for Virtual Visits (Telemedicine).  Patients are able to view lab/test results, encounter notes, upcoming appointments, etc.  Non-urgent messages can be sent to your provider as well.   To learn more about what you can do with MyChart, go to [email or web address].    Your next appointment:   3 month(s)  The format for your next appointment:   In Person  Provider:   Lamar Fitch, MD    Other Instructions NA

## 2023-10-10 ENCOUNTER — Encounter: Payer: Self-pay | Admitting: Cardiology

## 2023-10-10 ENCOUNTER — Ambulatory Visit: Payer: Medicare PPO | Attending: Cardiology | Admitting: Cardiology

## 2023-10-10 VITALS — BP 122/58 | HR 56 | Ht 72.0 in | Wt 187.6 lb

## 2023-10-10 DIAGNOSIS — R079 Chest pain, unspecified: Secondary | ICD-10-CM

## 2023-10-10 DIAGNOSIS — R001 Bradycardia, unspecified: Secondary | ICD-10-CM

## 2023-10-10 DIAGNOSIS — I480 Paroxysmal atrial fibrillation: Secondary | ICD-10-CM

## 2023-10-10 DIAGNOSIS — I10 Essential (primary) hypertension: Secondary | ICD-10-CM

## 2023-10-10 DIAGNOSIS — I4821 Permanent atrial fibrillation: Secondary | ICD-10-CM

## 2023-10-10 DIAGNOSIS — D6869 Other thrombophilia: Secondary | ICD-10-CM | POA: Diagnosis not present

## 2023-10-10 NOTE — Progress Notes (Unsigned)
 Cardiology Office Note:    Date:  10/10/2023   ID:  Kenneth Glen Sr., DOB December 10, 1930, MRN [id number]  PCP:  Everlean Cherry, MD  Cardiologist:  Gypsy Balsam, MD    Referring MD: Everlean Cherry, MD   Chief Complaint  Patient presents with   chest pain   BP fluctuation    History of Present Illness:    Kenneth Albus Sr. is a 89 y.o. male who past medical history significant for permanent atrial fibrillation, chest pain, parkinsonian, dementia he was referred to [place] because of some cardiac issues.  He seems to be doing quite well his wife did describe the fact that 1 day he woke up with chest pain.  She gave him aspirin and the pain went away.  Otherwise no dizziness no passing out from talking to patient he just smiles and said he is doing fine he have absolutely no problems.  They ask him if he can do some exercises which is from my point of view absolutely yes.  Past Medical History:  Diagnosis Date   Abnormal glucose 01/13/2016   Age-related osteoporosis without current pathological fracture 07/20/2017   Amnestic MCI (mild cognitive impairment with memory loss) 01/14/2016   Anticoagulant long-term use 08/28/2019   ASCVD (arteriosclerotic cardiovascular disease) 10/14/2021   At risk for delirium 08/22/2019   Benign hypertensive heart disease 01/13/2016   Bradycardia 08/16/2019   Carotid artery occlusion without infarction 01/13/2016   Chronic left shoulder pain 08/13/2019   COVID-19 virus infection 08/30/2022   DDD (degenerative disc disease), lumbosacral 01/13/2016   Delirium due to general medical condition 09/13/2022   Dementia without behavioral disturbance (HCC) 10/02/2020   Diverticulosis of colon 01/13/2016   Drug therapy 01/13/2016   Dysfunction of right eustachian tube 06/14/2018   Esophageal reflux 01/13/2016   Fecal occult blood test positive 08/14/2018   Gait disturbance 07/24/2018   Hay fever 07/20/2016   History of compression fracture  of spine 07/09/2018   History of delirium 08/22/2019   Hypercoagulable state due to paroxysmal atrial fibrillation (HCC) 04/21/2022   Hypertension, essential 08/22/2019   Hypocalcemia 09/13/2022   Hyponatremia 09/13/2022   Hypothyroidism, adult 12/15/2017   Iron deficiency anemia 04/18/2017   Mild neurocognitive disorder 06/30/2015   Mixed hyperlipidemia 08/28/2019   Obstructive sleep apnea (adult) (pediatric) 06/30/2015   Osteoarthritis, generalized 01/13/2016   Other dietary vitamin B12 deficiency anemia 10/12/2017   PAF (paroxysmal atrial fibrillation) (HCC) 08/16/2019   Pain in joints 01/13/2016   Parkinsonism (HCC) 10/17/2022   Permanent atrial fibrillation (HCC) 04/18/2023   PLMD (periodic limb movement disorder) 09/28/2015   Prediabetes 08/22/2019   Prostatism 07/20/2016   Resting ischemia due to ischemic heart disease 01/13/2016   Rotator cuff arthropathy, left 08/13/2019   S/P reverse total shoulder arthroplasty, left 09/03/2019   Statin intolerance 08/05/2019   Tympanosclerosis involving tympanic membrane only, bilateral 06/14/2018   With right monomeric membrane     Urinary incontinence, mixed 04/21/2022    Past Surgical History:  Procedure Laterality Date   BACK SURGERY     x2   hernia repaired     Shoulder revision replacement     THYROIDECTOMY      Current Medications: Current Meds  Medication Sig   acetaminophen (TYLENOL) 500 MG tablet Take 500 mg by mouth every 6 (six) hours as needed for mild pain (pain score 1-3) or moderate pain (pain score 4-6).   alendronate (FOSAMAX) 70 MG tablet Take 70 mg by mouth once a  week.   Ascorbic Acid (VITAMIN C PO) Take 1 tablet by mouth daily.   bethanechol (URECHOLINE) 25 MG tablet Take 25 mg by mouth 3 (three) times daily.   CALCIUM CITRATE PO Take 1,200 mg by mouth daily.   carbidopa-levodopa (SINEMET IR) 25-100 MG tablet Take 1 tablet by mouth daily.   Cholecalciferol (VITAMIN D3) 50 MCG (2000 UT) capsule Take 2,000  Units by mouth 2 (two) times daily.   cyanocobalamin (VITAMIN B12) 1000 MCG tablet Take 1,000 mcg by mouth daily.   ELIQUIS 5 MG TABS tablet Take 5 mg by mouth 2 (two) times daily.   fluticasone (FLONASE) 50 MCG/ACT nasal spray Place 1 spray into both nostrils daily.   HYDROcodone-acetaminophen (NORCO) 10-325 MG tablet Take 1 tablet by mouth daily.   Iron-Vitamin C (VITRON-C PO) Take 1 tablet by mouth daily. 65mg /125mg    levothyroxine (SYNTHROID) 25 MCG tablet Take 25 mcg by mouth daily before breakfast.   losartan (COZAAR) 100 MG tablet Take 100 mg by mouth daily.   magnesium oxide (MAG-OX) 400 (240 Mg) MG tablet Take 400 mg by mouth daily.   memantine (NAMENDA) 10 MG tablet Take 10 mg by mouth 2 (two) times daily.   nitroGLYCERIN (NITROSTAT) 0.4 MG SL tablet Place 1 tablet (0.4 mg total) under the tongue every 5 (five) minutes as needed for chest pain.   Omega-3 Fatty Acids (FISH OIL) 1200 MG CAPS Take 1 capsule by mouth 2 (two) times daily.   QUERCETIN PO Take 1 tablet by mouth daily.   tamsulosin (FLOMAX) 0.4 MG CAPS capsule Take 0.4 mg by mouth in the morning and at bedtime.   zinc gluconate 50 MG tablet Take 50 mg by mouth daily.     Allergies:   Rosuvastatin, Atorvastatin, Clarithromycin, Monosodium glutamate, Other, and Oxycodone   Social History   Socioeconomic History   Marital status: Married    Spouse name: Not on file   Number of children: Not on file   Years of education: Not on file   Highest education level: Not on file  Occupational History   Not on file  Tobacco Use   Smoking status: Former    Current packs/day: 0.00    Types: Cigarettes    Quit date: 04/18/1967    Years since quitting: 56.5   Smokeless tobacco: Never  Substance and Sexual Activity   Alcohol use: Never   Drug use: Never   Sexual activity: Not Currently  Other Topics Concern   Not on file  Social History Narrative   Not on file   Social Drivers of Health   Financial Resource Strain: Low  Risk  (11/13/2020)   Received from [facility]   Overall Financial Resource Strain (CARDIA)    Difficulty of Paying Living Expenses: Not hard at all  Food Insecurity: Low Risk  (10/16/2022)   Received from [facility]   Hunger Vital Sign    Within the past 12 months, you worried that your food would run out before you got money to buy more: Not on file    Ran Out of Food in the Last Year: Never true  Transportation Needs: Not on file (10/16/2022)  Physical Activity: Inactive (11/13/2020)   Received from [facility]   Exercise Vital Sign    Days of Exercise per Week: 0 days    Minutes of Exercise per Session: 0 min  Stress: No Stress Concern Present (11/13/2020)   Received from [facility]   [place]  [employer] [employer] [facility] - Occupational Stress Questionnaire    Feeling of Stress : Not at all  Social Connections: Unknown (11/07/2021)   Received from [facility]   Social Network    Social Network: Not on file     Family History: The patient's family history includes Stroke in his father and mother. ROS:   Please see the history of present illness.    All 14 point review of systems negative except as described per history of present illness  EKGs/Labs/Other Studies Reviewed:    EKG Interpretation Date/Time:  Tuesday October 10 2023 10:10:54 EDT Ventricular Rate:  56 PR Interval:    QRS Duration:  86 QT Interval:  410 QTC Calculation: 395 R Axis:   0  Text Interpretation: Atrial fibrillation with slow ventricular response Low voltage QRS Abnormal ECG When compared with ECG of 18-Apr-2023 11:05, ST no longer depressed in Inferior leads Confirmed by Ralene Burger [phone]) on 10/10/2023 10:16:59 AM    Recent Labs: No results found for requested labs within last 365 days.  Recent Lipid Panel No results found for: "CHOL", "TRIG", "HDL", "CHOLHDL", "VLDL", "LDLCALC", "LDLDIRECT"  Physical Exam:    VS:  BP (!) 122/58  (BP Location: Right Arm, Patient Position: Sitting)   Pulse (!) 56   Ht 6' (1.829 m)   Wt 187 lb 9.6 oz (85.1 kg)   SpO2 94%   BMI 25.44 kg/m     Wt Readings from Last 3 Encounters:  10/10/23 187 lb 9.6 oz (85.1 kg)  07/03/23 186 lb 9.6 oz (84.6 kg)  04/18/23 184 lb (83.5 kg)     GEN:  Well nourished, well developed in no acute distress HEENT: Normal NECK: No JVD; No carotid bruits LYMPHATICS: No lymphadenopathy CARDIAC: Irregularly irregular, no murmurs, no rubs, no gallops RESPIRATORY:  Clear to auscultation without rales, wheezing or rhonchi  ABDOMEN: Soft, non-tender, non-distended MUSCULOSKELETAL:  No edema; No deformity  SKIN: Warm and dry LOWER EXTREMITIES: no swelling NEUROLOGIC:  Alert and oriented x 3 PSYCHIATRIC:  Normal affect   ASSESSMENT:    1. Chest pain, unspecified type   2. Permanent atrial fibrillation (HCC)   3. Hypercoagulable state due to paroxysmal atrial fibrillation (HCC)   4. Hypertension, essential   5. Bradycardia    PLAN:    In order of problems listed above:  Chest pain she does have nitroglycerin his wife there is and asked her to give it to him when he got a piece of chest pain let me know how frequent she has to use it so far since I seen him last time there is only 1 episode which he described to have a chest pain. Permanent atrial fibrillation, rate is controlled he is anticoagulated dose is appropriate to his weight kidney function will continue. Essential hypertension blood pressure well-controlled.   Medication Adjustments/Labs and Tests Ordered: Current medicines are reviewed at length with the patient today.  Concerns regarding medicines are outlined above.  Orders Placed This Encounter  Procedures   EKG 12-Lead   Medication changes: No orders of the defined types were placed in this encounter.   Signed, Manfred Seed, MD, [facility] 10/10/2023 10:27 AM    [facility]

## 2023-10-10 NOTE — Patient Instructions (Signed)
 Medication Instructions:  Your physician recommends that you continue on your current medications as directed. Please refer to the Current Medication list given to you today.  *If you need a refill on your cardiac medications before your next appointment, please call your pharmacy*  Lab Work: NONE If you have labs (blood work) drawn today and your tests are completely normal, you will receive your results only by: MyChart Message (if you have MyChart) OR A paper copy in the mail If you have any lab test that is abnormal or we need to change your treatment, we will call you to review the results.  Testing/Procedures: NONE  Follow-Up: At [facility], you and your health needs are our priority.  As part of our continuing mission to provide you with exceptional heart care, our providers are all part of one team.  This team includes your primary Cardiologist (physician) and Advanced Practice Providers or APPs (Physician Assistants and Nurse Practitioners) who all work together to provide you with the care you need, when you need it.  Your next appointment:   6 month(s)  Provider:   Gypsy Balsam, MD    We recommend signing up for the patient portal called "MyChart".  Sign up information is provided on this After Visit Summary.  MyChart is used to connect with patients for Virtual Visits (Telemedicine).  Patients are able to view lab/test results, encounter notes, upcoming appointments, etc.  Non-urgent messages can be sent to your provider as well.   To learn more about what you can do with MyChart, go to [email or web address].   Other Instructions
